# Patient Record
Sex: Male | Born: 1990 | Race: Black or African American | Hispanic: No | Marital: Single
Health system: Midwestern US, Community
[De-identification: ages and names within clinical notes are randomized; demographics above are authoritative.]

---
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
## 2011-09-29 ENCOUNTER — Encounter (HOSPITAL_COMMUNITY): Payer: Self-pay | Admitting: *Deleted

## 2011-09-29 ENCOUNTER — Emergency Department (INDEPENDENT_AMBULATORY_CARE_PROVIDER_SITE_OTHER)
Admission: EM | Admit: 2011-09-29 | Discharge: 2011-09-29 | Disposition: A | Discharge status: Home / Self Care | Payer: Self-pay | Source: Home / Self Care | Attending: Emergency Medicine | Admitting: Emergency Medicine

## 2011-09-29 DIAGNOSIS — Z2089 Contact with and (suspected) exposure to other communicable diseases: Secondary | ICD-10-CM

## 2011-09-29 DIAGNOSIS — Z202 Contact with and (suspected) exposure to infections with a predominantly sexual mode of transmission: Secondary | ICD-10-CM

## 2011-09-29 MED ORDER — ONDANSETRON 4 MG PO TBDP
8.0000 mg | ORAL_TABLET | Freq: Once | ORAL | Status: AC
  Administered 2011-09-29: 8 mg via ORAL

## 2011-09-29 MED ORDER — ONDANSETRON 4 MG PO TBDP
ORAL_TABLET | ORAL | Status: AC
  Filled 2011-09-29: qty 2

## 2011-09-29 MED ORDER — AZITHROMYCIN 250 MG PO TABS
1000.0000 mg | ORAL_TABLET | Freq: Once | ORAL | Status: AC
  Administered 2011-09-29: 1000 mg via ORAL

## 2011-09-29 MED ORDER — AZITHROMYCIN 250 MG PO TABS
ORAL_TABLET | ORAL | Status: AC
  Filled 2011-09-29: qty 4

## 2011-09-29 NOTE — ED Provider Notes (Signed)
Chief Complaint  Patient presents with  . Exposure to STD    History of Present Illness:   Lonnie Russo is a 21 year old male who was informed 2 weeks ago by his girlfriend that she had Chlamydia. He states that her last intercourse was about 3 weeks ago. He denies any urethral discharge, dysuria, or penile pain. He has no penile lesions, fever, chills, adenopathy, skin rash, or joint pain. He has had no prior history of STDs.  Review of Systems:  Other than noted above, the patient denies any of the following symptoms: Systemic:  No fevers chills, aches, weight loss, arthralgias, myalgias, or adenopathy. GI:  No abdominal pain, nausea or vomiting. GU:  No dysuria, penile pain, discharge, itching, dysuria, genital lesions, testicular pain or swelling. Skin:  No rash or itching.  PMFSH:  Past medical history, family history, social history, meds, and allergies were reviewed.  Physical Exam:   Vital signs:  BP 127/70  Pulse 72  Temp 98 F (36.7 C) (Oral)  Resp 16  SpO2 100% Gen:  Alert, oriented, in no distress. Abdomen:  Soft and flat, non-distended, and non-tender.  No organomegaly or mass. Genital:  Normal genital exam. No urethral discharge, no lesions on the penis, no ulcerations, no interval adenopathy. Testes are normal without any tenderness or mass. He has no hernia. Skin:  Warm and dry.  No rash.   Other Labs Obtained at Urgent Care Center:  Urethral swab for GC and Chlamydia DNA probe, and serologies for HIV and RPR were obtained.  Results are pending at this time and we will call about any positive results.  Medications given in [facility]:  He was given azithromycin 1000 mg by mouth and tolerated this well without any immediate side effects.  Assessment:  The encounter diagnosis was Exposure to STD.  Plan:   1.  The following meds were prescribed:   New Prescriptions   No medications on file   2.  The patient was instructed in symptomatic care and handouts were given. 3.  The  patient was told to return if becoming worse in any way, if no better in 3 or 4 days, and given some red flag symptoms that would indicate earlier return. 4.  The patient was instructed to inform all sexual contacts, avoid intercourse completely for 2 weeks and then only with a condom.  The patient was told that we would call about all abnormal lab results, and that we would need to report certain kinds of infection to the health department.    Reuben Likes, MD 09/29/11 2214

## 2011-09-29 NOTE — ED Notes (Signed)
Pt states  He  May  Have  Been  exosed to  An  Std    specefically  chlymydia        Which  He  Was  Told   By   His  Girlfriend  She had        Poss  Std   He  denys  Any  Discharge    At  This time

## 2011-09-29 NOTE — ED Notes (Signed)
denys  Any pain 

## 2011-09-30 LAB — HIV ANTIBODY (ROUTINE TESTING W REFLEX): HIV: NONREACTIVE

## 2011-09-30 LAB — SYPHILIS: RPR W/REFLEX TO RPR TITER AND TREPONEMAL ANTIBODIES, TRADITIONAL SCREENING AND DIAGNOSIS ALGORITHM: RPR Ser Ql: NONREACTIVE

## 2011-09-30 LAB — GC/CHLAMYDIA PROBE AMP, GENITAL
Chlamydia, DNA Probe: NEGATIVE
GC Probe Amp, Genital: NEGATIVE

## 2011-12-25 ENCOUNTER — Encounter (HOSPITAL_COMMUNITY): Payer: Self-pay | Admitting: *Deleted

## 2011-12-25 ENCOUNTER — Emergency Department (HOSPITAL_COMMUNITY): Payer: No Typology Code available for payment source

## 2011-12-25 ENCOUNTER — Emergency Department (HOSPITAL_COMMUNITY)
Admission: EM | Admit: 2011-12-25 | Discharge: 2011-12-25 | Disposition: A | Discharge status: Home / Self Care | Payer: No Typology Code available for payment source | Attending: Emergency Medicine | Admitting: Emergency Medicine

## 2011-12-25 DIAGNOSIS — M25539 Pain in unspecified wrist: Secondary | ICD-10-CM

## 2011-12-25 DIAGNOSIS — S59909A Unspecified injury of unspecified elbow, initial encounter: Secondary | ICD-10-CM | POA: Insufficient documentation

## 2011-12-25 DIAGNOSIS — S139XXA Sprain of joints and ligaments of unspecified parts of neck, initial encounter: Secondary | ICD-10-CM | POA: Insufficient documentation

## 2011-12-25 DIAGNOSIS — S161XXA Strain of muscle, fascia and tendon at neck level, initial encounter: Secondary | ICD-10-CM

## 2011-12-25 DIAGNOSIS — S8990XA Unspecified injury of unspecified lower leg, initial encounter: Secondary | ICD-10-CM | POA: Insufficient documentation

## 2011-12-25 DIAGNOSIS — Y92410 Unspecified street and highway as the place of occurrence of the external cause: Secondary | ICD-10-CM | POA: Insufficient documentation

## 2011-12-25 DIAGNOSIS — S335XXA Sprain of ligaments of lumbar spine, initial encounter: Secondary | ICD-10-CM | POA: Insufficient documentation

## 2011-12-25 DIAGNOSIS — Y939 Activity, unspecified: Secondary | ICD-10-CM | POA: Insufficient documentation

## 2011-12-25 DIAGNOSIS — S99929A Unspecified injury of unspecified foot, initial encounter: Secondary | ICD-10-CM | POA: Insufficient documentation

## 2011-12-25 DIAGNOSIS — S6990XA Unspecified injury of unspecified wrist, hand and finger(s), initial encounter: Secondary | ICD-10-CM | POA: Insufficient documentation

## 2011-12-25 DIAGNOSIS — M545 Low back pain: Secondary | ICD-10-CM

## 2011-12-25 MED ORDER — IBUPROFEN 800 MG PO TABS
800.0000 mg | ORAL_TABLET | Freq: Once | ORAL | Status: AC
  Administered 2011-12-25: 800 mg via ORAL
  Filled 2011-12-25: qty 1

## 2011-12-25 MED ORDER — IBUPROFEN 600 MG PO TABS: 600.0000 mg | ORAL_TABLET | Freq: Four times a day (QID) | ORAL | Status: DC | PRN

## 2011-12-25 NOTE — ED Notes (Signed)
Spine board removed c-collar remains in place

## 2011-12-25 NOTE — ED Notes (Signed)
The pt is still in xray 

## 2011-12-25 NOTE — ED Notes (Signed)
The pt wanting to know how long for the xrays
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
## 2012-07-09 ENCOUNTER — Encounter (HOSPITAL_COMMUNITY): Payer: Self-pay | Admitting: Emergency Medicine

## 2012-07-09 ENCOUNTER — Emergency Department (INDEPENDENT_AMBULATORY_CARE_PROVIDER_SITE_OTHER)
Admission: EM | Admit: 2012-07-09 | Discharge: 2012-07-09 | Disposition: A | Discharge status: Home / Self Care | Payer: Self-pay | Source: Home / Self Care | Attending: Emergency Medicine | Admitting: Emergency Medicine

## 2012-07-09 DIAGNOSIS — T63481A Toxic effect of venom of other arthropod, accidental (unintentional), initial encounter: Secondary | ICD-10-CM

## 2012-07-09 DIAGNOSIS — T6391XA Toxic effect of contact with unspecified venomous animal, accidental (unintentional), initial encounter: Secondary | ICD-10-CM

## 2012-07-09 MED ORDER — HYDROXYZINE HCL 25 MG PO TABS: 25.0000 mg | ORAL_TABLET | Freq: Four times a day (QID) | ORAL | Status: DC

## 2012-07-09 MED ORDER — HYDROCODONE-ACETAMINOPHEN 5-325 MG PO TABS: ORAL_TABLET | ORAL | Status: DC

## 2012-07-09 MED ORDER — PREDNISONE 20 MG PO TABS: 20.0000 mg | ORAL_TABLET | Freq: Two times a day (BID) | ORAL | Status: DC

## 2012-07-09 NOTE — ED Notes (Signed)
Reported wasp sting

## 2012-07-09 NOTE — ED Provider Notes (Signed)
Chief Complaint:  No chief complaint on file.   History of Present Illness:   Lonnie Russo is a 22 year old male who was stung by a wasp on his right hand last night. He has had swelling, pain, heat, and redness over the first metacarpal. The pain extends up the forearm. He has pain with movement of his thumb and his wrist. He denies any fever or chills. He has no generalized hives or difficulty breathing. 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 This was accompanied by generalized hives and difficulty breathing.  Review of Systems:  Other than noted above, the patient denies any of the following symptoms: Systemic:  No fever, chills, sweats, weight loss, or fatigue. ENT:  No nasal congestion, rhinorrhea, sore throat, swelling of lips, tongue or throat. Resp:  No cough, wheezing, or shortness of breath. Skin:  No rash, itching, nodules, or suspicious lesions.  PMFSH:  Past medical history, family history, social history, meds, and allergies were reviewed.   Physical Exam:   Vital signs:  BP 114/70  Pulse 75  Temp(Src) 98.2 F (36.8 C) (Oral)  Resp 17  SpO2 99% Gen:  Alert, oriented, in no distress. ENT:  Pharynx clear, no intraoral lesions, moist mucous membranes. Lungs:  Clear to auscultation. Skin:  There is swelling, erythema, and heat over the first metacarpal. This extends into the MCP joint and to the wrist. It does not extend beyond the wrist. There is some pain to palpation of the forearm. The patient states the fingers look a little bit puffy, but I cannot tell us but looking at them. Pulses are full, sensation is intact, he has full range of motion of all joints, and good capillary refill.  Assessment:  The encounter diagnosis was Hymenoptera sting, initial encounter.  This is a severe local reaction. There is no evidence of systemic reaction and no evidence of infection.  Plan:   1.  The following meds were prescribed:   New Prescriptions   HYDROCODONE-ACETAMINOPHEN (NORCO/VICODIN) 5-325 MG PER TABLET    1 to 2 tabs every 4 to 6 hours as needed for pain.   HYDROXYZINE (ATARAX/VISTARIL) 25 MG TABLET    Take 1 tablet (25 mg total) by mouth every 6 (six) hours.   PREDNISONE (DELTASONE) 20 MG TABLET    Take 1 tablet (20 mg total) by mouth 2 (two) times daily.   2.  The patient was instructed in symptomatic care and handouts were given. 3.  The patient was told to return if becoming worse in any way, if no better in 3 or 4 days, and given some red flag symptoms such as difficulty breathing or fever that would indicate earlier return. 4.  Follow up here if necessary.     Reuben Likes, MD 07/09/12 [phone]

## 2012-12-14 ENCOUNTER — Emergency Department (INDEPENDENT_AMBULATORY_CARE_PROVIDER_SITE_OTHER)
Admission: EM | Admit: 2012-12-14 | Discharge: 2012-12-14 | Disposition: A | Discharge status: Home / Self Care | Payer: Self-pay | Source: Home / Self Care

## 2012-12-14 ENCOUNTER — Encounter (HOSPITAL_COMMUNITY): Payer: Self-pay | Admitting: Emergency Medicine

## 2012-12-14 DIAGNOSIS — Z9189 Other specified personal risk factors, not elsewhere classified: Secondary | ICD-10-CM

## 2012-12-14 DIAGNOSIS — Z202 Contact with and (suspected) exposure to infections with a predominantly sexual mode of transmission: Secondary | ICD-10-CM

## 2012-12-14 NOTE — ED Notes (Signed)
Reports a friend reports he has been exposed to herpes.  Patient denies any sores or blisters.

## 2012-12-14 NOTE — ED Provider Notes (Signed)
CSN: [id number]     Arrival date & time 12/14/12  1228 History   None    Chief Complaint  Patient presents with  . Exposure to STD   (Consider location/radiation/quality/duration/timing/severity/associated sxs/prior Treatment) HPI Comments: A friend of a friend told this 22 year old male that his sexual partner history of genital herpes and the patient wants to know if he has. Exposure to place last week. Patient denies having any symptoms.  Patient is a 22 y.o. male presenting with STD exposure.  Exposure to STD    History reviewed. No pertinent past medical history. History reviewed. No pertinent past surgical history. No family history on file. History  Substance Use Topics  . Smoking status: Current Every Day Smoker  . Smokeless tobacco: Not on file  . Alcohol Use: Yes    Review of Systems  Constitutional: Negative.   All other systems reviewed and are negative.    Allergies  Bee venom  Home Medications   Current Outpatient Rx  Name  Route  Sig  Dispense  Refill  . HYDROcodone-acetaminophen (NORCO/VICODIN) 5-325 MG per tablet      1 to 2 tabs every 4 to 6 hours as needed for pain.   20 tablet   0   . hydrOXYzine (ATARAX/VISTARIL) 25 MG tablet   Oral   Take 1 tablet (25 mg total) by mouth every 6 (six) hours.   12 tablet   0   . ibuprofen (ADVIL,MOTRIN) 600 MG tablet   Oral   Take 1 tablet (600 mg total) by mouth every 6 (six) hours as needed for pain.   30 tablet   0   . predniSONE (DELTASONE) 20 MG tablet   Oral   Take 1 tablet (20 mg total) by mouth 2 (two) times daily.   10 tablet   0    BP 116/64  Pulse 69  Temp(Src) 99.5 F (37.5 C) (Oral)  Resp 18  SpO2 99% Physical Exam  Nursing note and vitals reviewed. Constitutional: He is oriented to person, place, and time. He appears well-developed and well-nourished. No distress.  Pulmonary/Chest: Effort normal. No respiratory distress.  Neurological: He is alert and oriented to person,  place, and time. He exhibits normal muscle tone.  Skin: Skin is warm and dry. No rash noted. No erythema.    ED Course  Procedures (including critical care time) Labs Review Labs Reviewed  HSV 2 ANTIBODY, IGG   Imaging Review No results found.      MDM   1. Possible exposure to STD     Herpes II lab drawn Info on genital herpes.     Hayden Rasmussen, NP 12/14/12 [phone]

## 2012-12-14 NOTE — ED Provider Notes (Signed)
Medical screening examination/treatment/procedure(s) were performed by resident physician or non-physician practitioner and as supervising physician I was immediately available for consultation/collaboration.   Gissella Niblack DOUGLAS MD.   Deonta Bomberger D Ramsay Bognar, MD 12/14/12 1642 

## 2012-12-16 LAB — HSV 2 ANTIBODY, IGG: HSV 2 Glycoprotein G Ab, IgG: <0.1 IV

## 2016-03-13 ENCOUNTER — Emergency Department (HOSPITAL_COMMUNITY)
Admission: EM | Admit: 2016-03-13 | Discharge: 2016-03-14 | Disposition: A | Discharge status: Home / Self Care | Payer: No Typology Code available for payment source | Attending: Physician Assistant | Admitting: Physician Assistant

## 2016-03-13 ENCOUNTER — Emergency Department (HOSPITAL_COMMUNITY): Payer: No Typology Code available for payment source

## 2016-03-13 ENCOUNTER — Encounter (HOSPITAL_COMMUNITY): Payer: Self-pay | Admitting: *Deleted

## 2016-03-13 DIAGNOSIS — Y92410 Unspecified street and highway as the place of occurrence of the external cause: Secondary | ICD-10-CM | POA: Diagnosis not present

## 2016-03-13 DIAGNOSIS — Y939 Activity, unspecified: Secondary | ICD-10-CM | POA: Insufficient documentation

## 2016-03-13 DIAGNOSIS — Y999 Unspecified external cause status: Secondary | ICD-10-CM | POA: Diagnosis not present

## 2016-03-13 DIAGNOSIS — S39012A Strain of muscle, fascia and tendon of lower back, initial encounter: Secondary | ICD-10-CM | POA: Insufficient documentation

## 2016-03-13 DIAGNOSIS — S161XXA Strain of muscle, fascia and tendon at neck level, initial encounter: Secondary | ICD-10-CM

## 2016-03-13 DIAGNOSIS — F17200 Nicotine dependence, unspecified, uncomplicated: Secondary | ICD-10-CM | POA: Insufficient documentation

## 2016-03-13 DIAGNOSIS — Z79899 Other long term (current) drug therapy: Secondary | ICD-10-CM | POA: Diagnosis not present

## 2016-03-13 DIAGNOSIS — S199XXA Unspecified injury of neck, initial encounter: Secondary | ICD-10-CM | POA: Diagnosis present

## 2016-03-13 MED ORDER — CYCLOBENZAPRINE HCL 10 MG PO TABS: 10.0000 mg | ORAL_TABLET | Freq: Two times a day (BID) | ORAL | 0 refills | Status: DC | PRN

## 2016-03-13 MED ORDER — IBUPROFEN 400 MG PO TABS
600.0000 mg | ORAL_TABLET | Freq: Once | ORAL | Status: AC
  Administered 2016-03-13: 600 mg via ORAL
  Filled 2016-03-13: qty 1

## 2016-03-13 MED ORDER — DICLOFENAC SODIUM 50 MG PO TBEC: 50.0000 mg | DELAYED_RELEASE_TABLET | Freq: Two times a day (BID) | ORAL | 0 refills | Status: DC

## 2016-03-13 MED ORDER — CYCLOBENZAPRINE HCL 10 MG PO TABS
10.0000 mg | ORAL_TABLET | Freq: Once | ORAL | Status: AC
  Administered 2016-03-13: 10 mg via ORAL
  Filled 2016-03-13: qty 1

## 2016-03-13 NOTE — Discharge Instructions (Signed)
Do not take the muscle relaxant when driving as it will make you sleepy. °

## 2016-03-13 NOTE — ED Notes (Signed)
Patient transported to X-ray 
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
## 2016-07-20 ENCOUNTER — Inpatient Hospital Stay
Admit: 2016-07-20 | Discharge: 2016-07-20 | Disposition: A | Discharge status: Home / Self Care | Payer: Medicaid - Out of State | Attending: Emergency Medicine

## 2016-07-20 DIAGNOSIS — T7840XA Allergy, unspecified, initial encounter: Secondary | ICD-10-CM

## 2016-07-20 MED ORDER — FAMOTIDINE 20 MG TAB
20 mg | ORAL | Status: AC
Start: 2016-07-20 — End: 2016-07-20
  Administered 2016-07-20: 17:00:00 via ORAL

## 2016-07-20 MED ORDER — DIPHENHYDRAMINE 25 MG CAP
25 mg | ORAL | Status: AC
Start: 2016-07-20 — End: 2016-07-20
  Administered 2016-07-20: 17:00:00 via ORAL

## 2016-07-20 MED ORDER — PREDNISONE 20 MG TAB
20 mg | ORAL | Status: AC
Start: 2016-07-20 — End: 2016-07-20
  Administered 2016-07-20: 17:00:00 via ORAL

## 2016-07-20 MED FILL — DIPHENHYDRAMINE 25 MG CAP: 25 mg | ORAL | Qty: 1

## 2016-07-20 MED FILL — PREDNISONE 20 MG TAB: 20 mg | ORAL | Qty: 2

## 2016-07-20 MED FILL — FAMOTIDINE 20 MG TAB: 20 mg | ORAL | Qty: 1

## 2016-07-20 NOTE — ED Provider Notes (Signed)
Patient is a 26 y.o. male presenting with allergic reaction. The history is provided by the patient. No language interpreter was used.   Allergic Reaction    This is a new problem. The current episode started 1 to 2 hours ago. The problem has been gradually worsening. Ingestion amount is known. He has not vomited. Pertinent negatives include no nausea, no vomiting and no shortness of breath.    this patient presents the ED with complaint of diffuse rash and itching after ingesting mushrooms this morning at breakfast.  He states that he ate a proximally 7:30 AM and woke up at 11 AM with intense itching and rash.  States that he attributes his symptoms to the mushroom ingestion.  States that he did not take anything, and that his symptoms have been gradually worsening since onset.  He denies any fever, difficulty swallowing, difficulty breathing, or wheezing.    History reviewed. No pertinent past medical history.    History reviewed. No pertinent surgical history.      History reviewed. No pertinent family history.    Social History     Social History   ??? Marital status: SINGLE     Spouse name: N/A   ??? Number of children: N/A   ??? Years of education: N/A     Occupational History   ??? Not on file.     Social History Main Topics   ??? Smoking status: Current Every Day Smoker   ??? Smokeless tobacco: Never Used      Comment: black and milds-3 per day   ??? Alcohol use Not on file   ??? Drug use: Yes     Special: Marijuana   ??? Sexual activity: Not on file     Other Topics Concern   ??? Not on file     Social History Narrative   ??? No narrative on file         ALLERGIES: Stings [sting, bee]    Review of Systems   Constitutional: Negative for chills and fever.   HENT: Negative for congestion, facial swelling, sore throat, trouble swallowing and voice change.    Respiratory: Negative for cough, chest tightness, shortness of breath, wheezing and stridor.    Gastrointestinal: Negative for abdominal pain, nausea and vomiting.    Musculoskeletal: Negative for back pain and neck pain.   Skin: Positive for color change and rash.   Neurological: Negative for dizziness, light-headedness and numbness.       Vitals:    07/20/16 1253   Pulse: 68   Resp: 18   Temp: 97.9 ??F (36.6 ??C)   SpO2: 100%   Weight: 72.6 kg (160 lb)   Height: 6\' 3"  (1.905 m)            Physical Exam   Constitutional: He is oriented to person, place, and time. He appears well-developed and well-nourished. He appears distressed.   HENT:   Head: Normocephalic and atraumatic.   Nose: No mucosal edema or rhinorrhea.   Mouth/Throat: No trismus in the jaw. No uvula swelling. No oropharyngeal exudate, posterior oropharyngeal edema or posterior oropharyngeal erythema.   Neck: Normal range of motion. Neck supple. No tracheal deviation present.   Cardiovascular: Normal rate, normal heart sounds and intact distal pulses.  Exam reveals no gallop.    No murmur heard.  Pulmonary/Chest: Effort normal and breath sounds normal. No stridor. No respiratory distress. He has no wheezes. He has no rales. He exhibits no tenderness.   Musculoskeletal: Normal range of  motion. He exhibits no edema.   Lymphadenopathy:     He has no cervical adenopathy.   Neurological: He is alert and oriented to person, place, and time.   Skin: Skin is warm and dry. Rash noted.   Diffuse urticarial rash to trunk, anterior forehead, and bilateral upper extremities   Vitals reviewed.       MDM  Number of Diagnoses or Management Options  Allergic reaction, initial encounter: new and requires workup  Diagnosis management comments: Oral treatment initiated for apparent acute allergic reaction.    Patient's symptoms have completely resolved.  Patient feeling much better at this time.  We will disposition the patient home for follow-up with PCP.  I recommended additional treatment recommendations; the patient voiced full understanding and compliance with this plan.    Risk of Complications, Morbidity, and/or Mortality   Presenting problems: moderate  Diagnostic procedures: minimal  Management options: minimal    Patient Progress  Patient progress: improved        ED Course       Procedures         Portions of this chart were dictated using a voice-to-text program, [employer] educational [employer]. While care was taken to eliminate any dictation inaccuracies, please excuse any dictation errors.

## 2016-07-20 NOTE — ED Triage Notes (Signed)
Pt states he ate something with mushrooms and is now breaking out with bumps and has some swelling in his face and neck.

## 2016-07-20 NOTE — ED Notes (Signed)

## 2017-04-20 ENCOUNTER — Emergency Department (HOSPITAL_COMMUNITY)
Admission: EM | Admit: 2017-04-20 | Discharge: 2017-04-20 | Disposition: A | Discharge status: Home / Self Care | Payer: No Typology Code available for payment source | Attending: Emergency Medicine | Admitting: Emergency Medicine

## 2017-04-20 ENCOUNTER — Encounter (HOSPITAL_COMMUNITY): Payer: Self-pay | Admitting: Emergency Medicine

## 2017-04-20 ENCOUNTER — Ambulatory Visit (HOSPITAL_COMMUNITY): Admission: RE | Admit: 2017-04-20 | Payer: No Typology Code available for payment source | Source: Ambulatory Visit

## 2017-04-20 ENCOUNTER — Emergency Department (HOSPITAL_COMMUNITY): Payer: No Typology Code available for payment source

## 2017-04-20 DIAGNOSIS — M542 Cervicalgia: Secondary | ICD-10-CM | POA: Insufficient documentation

## 2017-04-20 DIAGNOSIS — F17210 Nicotine dependence, cigarettes, uncomplicated: Secondary | ICD-10-CM | POA: Diagnosis not present

## 2017-04-20 DIAGNOSIS — M25562 Pain in left knee: Secondary | ICD-10-CM | POA: Insufficient documentation

## 2017-04-20 DIAGNOSIS — M25512 Pain in left shoulder: Secondary | ICD-10-CM | POA: Diagnosis not present

## 2017-04-20 DIAGNOSIS — Z041 Encounter for examination and observation following transport accident: Secondary | ICD-10-CM | POA: Diagnosis present

## 2017-04-20 MED ORDER — METHOCARBAMOL 500 MG PO TABS: 500.0000 mg | ORAL_TABLET | Freq: Two times a day (BID) | ORAL | 0 refills | Status: DC

## 2017-04-20 MED ORDER — METHOCARBAMOL 500 MG PO TABS
750.0000 mg | ORAL_TABLET | Freq: Once | ORAL | Status: AC
  Administered 2017-04-20: 750 mg via ORAL
  Filled 2017-04-20: qty 2

## 2017-04-20 MED ORDER — ACETAMINOPHEN 500 MG PO TABS
1000.0000 mg | ORAL_TABLET | Freq: Once | ORAL | Status: AC
  Administered 2017-04-20: 1000 mg via ORAL
  Filled 2017-04-20: qty 2
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
## 2018-06-24 ENCOUNTER — Encounter (HOSPITAL_COMMUNITY): Payer: Self-pay | Admitting: Emergency Medicine

## 2018-06-24 ENCOUNTER — Other Ambulatory Visit: Payer: Self-pay

## 2018-06-24 ENCOUNTER — Emergency Department (HOSPITAL_COMMUNITY)
Admission: EM | Admit: 2018-06-24 | Discharge: 2018-06-24 | Disposition: A | Discharge status: Home / Self Care | Payer: Self-pay | Attending: Emergency Medicine | Admitting: Emergency Medicine

## 2018-06-24 DIAGNOSIS — F17200 Nicotine dependence, unspecified, uncomplicated: Secondary | ICD-10-CM | POA: Insufficient documentation

## 2018-06-24 DIAGNOSIS — M791 Myalgia, unspecified site: Secondary | ICD-10-CM | POA: Insufficient documentation

## 2018-06-24 MED ORDER — IBUPROFEN 400 MG PO TABS: 600.0000 mg | ORAL_TABLET | Freq: Once | ORAL | Status: DC

## 2018-06-24 MED ORDER — CYCLOBENZAPRINE HCL 10 MG PO TABS: 10.0000 mg | ORAL_TABLET | Freq: Two times a day (BID) | ORAL | 0 refills | Status: DC | PRN

## 2018-06-24 MED ORDER — CYCLOBENZAPRINE HCL 10 MG PO TABS: 10.0000 mg | ORAL_TABLET | Freq: Once | ORAL | Status: DC

## 2018-06-24 MED ORDER — IBUPROFEN 600 MG PO TABS: 600.0000 mg | ORAL_TABLET | Freq: Four times a day (QID) | ORAL | 0 refills | Status: DC | PRN
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
## 2018-06-24 NOTE — Discharge Instructions (Addendum)
Take Motrin as needed as prescribed for muscle soreness.  Take Flexeril as needed as prescribed for muscle spasm.  Do not drive or operate machinery if taking Flexeril. Apply warm compresses to sore muscles, gentle stretching. Follow-up with your family doctor if pain not improving as expected.  Return to ER for severe or concerning symptoms.

## 2018-06-24 NOTE — ED Notes (Signed)
Patient verbalizes understanding of discharge instructions. Opportunity for questioning and answers were provided. Armband removed by staff, pt discharged from ED home via POV.  
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
## 2023-06-14 ENCOUNTER — Emergency Department (HOSPITAL_COMMUNITY)
Admission: EM | Admit: 2023-06-14 | Discharge: 2023-06-15 | Disposition: A | Discharge status: Home / Self Care | Attending: Emergency Medicine | Admitting: Emergency Medicine

## 2023-06-14 DIAGNOSIS — Y92410 Unspecified street and highway as the place of occurrence of the external cause: Secondary | ICD-10-CM | POA: Diagnosis not present

## 2023-06-14 DIAGNOSIS — S3992XA Unspecified injury of lower back, initial encounter: Secondary | ICD-10-CM | POA: Diagnosis present

## 2023-06-14 DIAGNOSIS — S46911A Strain of unspecified muscle, fascia and tendon at shoulder and upper arm level, right arm, initial encounter: Secondary | ICD-10-CM | POA: Insufficient documentation

## 2023-06-14 DIAGNOSIS — S39012A Strain of muscle, fascia and tendon of lower back, initial encounter: Secondary | ICD-10-CM | POA: Diagnosis not present
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
## 2023-06-15 ENCOUNTER — Emergency Department (HOSPITAL_COMMUNITY)

## 2023-06-15 MED ORDER — IBUPROFEN 200 MG PO TABS
600.0000 mg | ORAL_TABLET | Freq: Once | ORAL | Status: AC
  Administered 2023-06-15: 600 mg via ORAL
  Filled 2023-06-15: qty 3

## 2023-06-15 MED ORDER — TIZANIDINE HCL 4 MG PO TABS: 4.0000 mg | ORAL_TABLET | Freq: Four times a day (QID) | ORAL | 0 refills | Status: AC | PRN

## 2023-06-15 MED ORDER — TIZANIDINE HCL 4 MG PO TABS
4.0000 mg | ORAL_TABLET | Freq: Once | ORAL | Status: AC
  Administered 2023-06-15: 4 mg via ORAL
  Filled 2023-06-15: qty 1

## 2023-06-15 MED ORDER — IBUPROFEN 600 MG PO TABS: 600.0000 mg | ORAL_TABLET | Freq: Four times a day (QID) | ORAL | 0 refills | Status: AC | PRN
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
## 2023-06-15 NOTE — ED Notes (Signed)
 Discharge instructions reviewed.   Newly prescribed medications discussed. Pharmacy verified.   Opportunity for questions and concerns provided.   Alert, oriented and ambulatory. Displays no signs of distress.
